# Patient Record
Sex: Male | Born: 1975 | Race: White | Hispanic: No | Smoking: Never smoker
Health system: Southern US, Community
[De-identification: ages and names within clinical notes are randomized; demographics above are authoritative.]

## PROBLEM LIST (undated history)

## (undated) DIAGNOSIS — T8859XA Other complications of anesthesia, initial encounter: Secondary | ICD-10-CM

## (undated) DIAGNOSIS — I10 Essential (primary) hypertension: Secondary | ICD-10-CM

## (undated) DIAGNOSIS — Z9889 Other specified postprocedural states: Secondary | ICD-10-CM

## (undated) DIAGNOSIS — K219 Gastro-esophageal reflux disease without esophagitis: Secondary | ICD-10-CM

## (undated) DIAGNOSIS — G4730 Sleep apnea, unspecified: Secondary | ICD-10-CM

## (undated) DIAGNOSIS — T4145XA Adverse effect of unspecified anesthetic, initial encounter: Secondary | ICD-10-CM

## (undated) DIAGNOSIS — Z8719 Personal history of other diseases of the digestive system: Secondary | ICD-10-CM

## (undated) DIAGNOSIS — R112 Nausea with vomiting, unspecified: Secondary | ICD-10-CM

## (undated) HISTORY — PX: ADENOIDECTOMY: SHX5191

## (undated) HISTORY — PX: ANTERIOR CRUCIATE LIGAMENT REPAIR: SHX115

## (undated) HISTORY — PX: HERNIA REPAIR: SHX51

## (undated) HISTORY — PX: CHOLECYSTECTOMY: SHX55

## (undated) HISTORY — PX: SHOULDER SURGERY: SHX246

---

## 2006-01-31 ENCOUNTER — Encounter: Admission: RE | Admit: 2006-01-31 | Discharge: 2006-01-31 | Payer: Self-pay | Admitting: Orthopedic Surgery

## 2016-03-04 ENCOUNTER — Other Ambulatory Visit: Payer: Self-pay | Admitting: Specialist

## 2016-03-04 ENCOUNTER — Other Ambulatory Visit (HOSPITAL_COMMUNITY): Payer: Self-pay | Admitting: Specialist

## 2018-02-06 ENCOUNTER — Other Ambulatory Visit: Payer: Self-pay | Admitting: Sports Medicine

## 2018-02-06 DIAGNOSIS — M25421 Effusion, right elbow: Secondary | ICD-10-CM

## 2018-02-06 DIAGNOSIS — M25521 Pain in right elbow: Secondary | ICD-10-CM

## 2018-02-14 ENCOUNTER — Other Ambulatory Visit: Payer: Self-pay

## 2018-02-14 ENCOUNTER — Encounter
Admission: RE | Admit: 2018-02-14 | Discharge: 2018-02-14 | Disposition: A | Discharge status: Home / Self Care | Payer: BLUE CROSS/BLUE SHIELD | Source: Ambulatory Visit | Attending: Orthopedic Surgery | Admitting: Orthopedic Surgery

## 2018-02-14 HISTORY — DX: Personal history of other diseases of the digestive system: Z87.19

## 2018-02-14 HISTORY — DX: Other complications of anesthesia, initial encounter: T88.59XA

## 2018-02-14 HISTORY — DX: Gastro-esophageal reflux disease without esophagitis: K21.9

## 2018-02-14 HISTORY — DX: Nausea with vomiting, unspecified: R11.2

## 2018-02-14 HISTORY — DX: Sleep apnea, unspecified: G47.30

## 2018-02-14 HISTORY — DX: Adverse effect of unspecified anesthetic, initial encounter: T41.45XA

## 2018-02-14 HISTORY — DX: Essential (primary) hypertension: I10

## 2018-02-14 HISTORY — DX: Nausea with vomiting, unspecified: Z98.890

## 2018-02-14 NOTE — Patient Instructions (Signed)
Your procedure is scheduled on: 02-16-18 FRIDAY Report to Same Day Surgery 2nd floor medical mall [facility] Entrance-take elevator on left to 2nd floor.  Check in with surgery information desk.) To find out your arrival time please call [phone] between 1PM - 3PM on 02-15-18 THURSDAY  Remember: Instructions that are not followed completely may result in serious medical risk, up to and including death, or upon the discretion of your surgeon and anesthesiologist your surgery may need to be rescheduled.    _x___ 1. Do not eat food after midnight the night before your procedure. NO GUM OR CANDY AFTER MIDNIGHT. You may drink clear liquids up to 2 hours before you are scheduled to arrive at the hospital for your procedure.  Do not drink clear liquids within 2 hours of your scheduled arrival to the hospital.  Clear liquids include  --Water or Apple juice without pulp  --Clear carbohydrate beverage such as ClearFast or Gatorade  --Black Coffee or Clear Tea (No milk, no creamers, do not add anything to the coffee or Tea    __x__ 2. No Alcohol for 24 hours before or after surgery.   __x__3. No Smoking or e-cigarettes for 24 prior to surgery.  Do not use any chewable tobacco products for at least 6 hour prior to surgery   ____  4. Bring all medications with you on the day of surgery if instructed.    __x__ 5. Notify your doctor if there is any change in your medical condition     (cold, fever, infections).    x___6. On the morning of surgery brush your teeth with toothpaste and water.  You may rinse your mouth with mouth wash if you wish.  Do not swallow any toothpaste or mouthwash.   Do not wear jewelry, make-up, hairpins, clips or nail polish.  Do not wear lotions, powders, or perfumes. You may wear deodorant.  Do not shave 48 hours prior to surgery. Men may shave face and neck.  Do not bring valuables to the hospital.    [facility] is not responsible for any belongings or  valuables.               Contacts, dentures or bridgework may not be worn into surgery.  Leave your suitcase in the car. After surgery it may be brought to your room.  For patients admitted to the hospital, discharge time is determined by your treatment team.  _  Patients discharged the day of surgery will not be allowed to drive home.  You will need someone to drive you home and stay with you the night of your procedure.    Please read over the following fact sheets that you were given:   [facility] Preparing for Surgery and or MRSA Information   _x___ TAKE THE FOLLOWING MEDICATION THE MORNING OF SURGERY WITH A SMALL SIP OF WATER. These include:  1. OMEPRAZOLE  2. TAKE AN EXTRA OMEPRAZOLE Thursday NIGHT BEFORE BED  3.  4.  5.  6.  ____Fleets enema or Magnesium Citrate as directed.   _x___ Use CHG Soap or sage wipes as directed on instruction sheet   ____ Use inhalers on the day of surgery and bring to hospital day of surgery  ____ Stop Metformin and Janumet 2 days prior to surgery.    ____ Take 1/2 of usual insulin dose the night before surgery and none on the morning surgery.   ____ Follow recommendations from Cardiologist, Pulmonologist or PCP regarding stopping Aspirin, Coumadin,  Plavix ,Eliquis, Effient, or Pradaxa, and Pletal.  X____Stop Anti-inflammatories such as Advil, Aleve, Ibuprofen, Motrin, Naproxen, Naprosyn, Goodies powders or aspirin products NOW-OK to take Tylenol    _x___ Stop supplements until after surgery-STOP GLUCOSAMINE-CHONDROITIN NOW-MAY RESUME AFTER SURGERY   ____ Bring C-Pap to the hospital.

## 2018-02-15 ENCOUNTER — Encounter
Admission: RE | Admit: 2018-02-15 | Discharge: 2018-02-15 | Disposition: A | Discharge status: Home / Self Care | Payer: BLUE CROSS/BLUE SHIELD | Source: Ambulatory Visit | Attending: Orthopedic Surgery | Admitting: Orthopedic Surgery

## 2018-02-15 DIAGNOSIS — K219 Gastro-esophageal reflux disease without esophagitis: Secondary | ICD-10-CM | POA: Diagnosis not present

## 2018-02-15 DIAGNOSIS — Y998 Other external cause status: Secondary | ICD-10-CM | POA: Diagnosis not present

## 2018-02-15 DIAGNOSIS — Y9375 Activity, martial arts: Secondary | ICD-10-CM | POA: Diagnosis not present

## 2018-02-15 DIAGNOSIS — S46211A Strain of muscle, fascia and tendon of other parts of biceps, right arm, initial encounter: Secondary | ICD-10-CM | POA: Diagnosis not present

## 2018-02-15 DIAGNOSIS — X58XXXA Exposure to other specified factors, initial encounter: Secondary | ICD-10-CM | POA: Diagnosis not present

## 2018-02-15 DIAGNOSIS — I10 Essential (primary) hypertension: Secondary | ICD-10-CM | POA: Diagnosis not present

## 2018-02-15 DIAGNOSIS — Y9239 Other specified sports and athletic area as the place of occurrence of the external cause: Secondary | ICD-10-CM | POA: Diagnosis not present

## 2018-02-15 DIAGNOSIS — M7521 Bicipital tendinitis, right shoulder: Secondary | ICD-10-CM | POA: Diagnosis not present

## 2018-02-15 DIAGNOSIS — Z79899 Other long term (current) drug therapy: Secondary | ICD-10-CM | POA: Diagnosis not present

## 2018-02-15 LAB — POTASSIUM: Potassium: 3.5 mmol/L (ref 3.5–5.1)

## 2018-02-15 MED ORDER — CEFAZOLIN SODIUM-DEXTROSE 2-4 GM/100ML-% IV SOLN
2.0000 g | Freq: Once | INTRAVENOUS | Status: AC
  Administered 2018-02-16: 2 g via INTRAVENOUS

## 2018-02-16 ENCOUNTER — Other Ambulatory Visit: Payer: Self-pay

## 2018-02-16 ENCOUNTER — Ambulatory Visit: Payer: BLUE CROSS/BLUE SHIELD | Admitting: Anesthesiology

## 2018-02-16 ENCOUNTER — Ambulatory Visit
Admission: RE | Admit: 2018-02-16 | Discharge: 2018-02-16 | Disposition: A | Discharge status: Home / Self Care | Payer: BLUE CROSS/BLUE SHIELD | Source: Ambulatory Visit | Attending: Orthopedic Surgery | Admitting: Orthopedic Surgery

## 2018-02-16 ENCOUNTER — Ambulatory Visit: Payer: BLUE CROSS/BLUE SHIELD

## 2018-02-16 ENCOUNTER — Encounter
Admission: RE | Disposition: A | Discharge status: Home / Self Care | Payer: Self-pay | Source: Ambulatory Visit | Attending: Orthopedic Surgery

## 2018-02-16 DIAGNOSIS — I10 Essential (primary) hypertension: Secondary | ICD-10-CM | POA: Insufficient documentation

## 2018-02-16 DIAGNOSIS — K219 Gastro-esophageal reflux disease without esophagitis: Secondary | ICD-10-CM | POA: Insufficient documentation

## 2018-02-16 DIAGNOSIS — S46211A Strain of muscle, fascia and tendon of other parts of biceps, right arm, initial encounter: Secondary | ICD-10-CM | POA: Diagnosis not present

## 2018-02-16 DIAGNOSIS — Y9375 Activity, martial arts: Secondary | ICD-10-CM | POA: Insufficient documentation

## 2018-02-16 DIAGNOSIS — Z419 Encounter for procedure for purposes other than remedying health state, unspecified: Secondary | ICD-10-CM

## 2018-02-16 DIAGNOSIS — Y998 Other external cause status: Secondary | ICD-10-CM | POA: Insufficient documentation

## 2018-02-16 DIAGNOSIS — Z79899 Other long term (current) drug therapy: Secondary | ICD-10-CM | POA: Insufficient documentation

## 2018-02-16 DIAGNOSIS — M7521 Bicipital tendinitis, right shoulder: Secondary | ICD-10-CM | POA: Insufficient documentation

## 2018-02-16 DIAGNOSIS — Y9239 Other specified sports and athletic area as the place of occurrence of the external cause: Secondary | ICD-10-CM | POA: Insufficient documentation

## 2018-02-16 DIAGNOSIS — X58XXXA Exposure to other specified factors, initial encounter: Secondary | ICD-10-CM | POA: Insufficient documentation

## 2018-02-16 HISTORY — PX: DISTAL BICEPS TENDON REPAIR: SHX1461

## 2018-02-16 SURGERY — REPAIR, TENDON, BICEPS, DISTAL
Anesthesia: General | Laterality: Right

## 2018-02-16 MED ORDER — OXYCODONE HCL 5 MG PO TABS
ORAL_TABLET | ORAL | Status: AC
  Filled 2018-02-16: qty 1

## 2018-02-16 MED ORDER — ACETAMINOPHEN 500 MG PO TABS: 1000.0000 mg | ORAL_TABLET | Freq: Three times a day (TID) | ORAL | 2 refills | Status: AC

## 2018-02-16 MED ORDER — ASPIRIN EC 325 MG PO TBEC: 325.0000 mg | DELAYED_RELEASE_TABLET | Freq: Every day | ORAL | 0 refills | Status: AC

## 2018-02-16 MED ORDER — ACETAMINOPHEN 10 MG/ML IV SOLN
INTRAVENOUS | Status: AC
  Filled 2018-02-16: qty 100

## 2018-02-16 MED ORDER — ONDANSETRON HCL 4 MG/2ML IJ SOLN
INTRAMUSCULAR | Status: AC
  Filled 2018-02-16: qty 2

## 2018-02-16 MED ORDER — ONDANSETRON 4 MG PO TBDP: 4.0000 mg | ORAL_TABLET | Freq: Three times a day (TID) | ORAL | 0 refills | Status: AC | PRN

## 2018-02-16 MED ORDER — FENTANYL CITRATE (PF) 100 MCG/2ML IJ SOLN
INTRAMUSCULAR | Status: AC
  Filled 2018-02-16: qty 2

## 2018-02-16 MED ORDER — LIDOCAINE HCL (PF) 2 % IJ SOLN
INTRAMUSCULAR | Status: AC
  Filled 2018-02-16: qty 10

## 2018-02-16 MED ORDER — NEOMYCIN-POLYMYXIN B GU 40-200000 IR SOLN
Status: AC
  Filled 2018-02-16: qty 2

## 2018-02-16 MED ORDER — LACTATED RINGERS IV SOLN
INTRAVENOUS | Status: DC | PRN
  Administered 2018-02-16 (×2): via INTRAVENOUS

## 2018-02-16 MED ORDER — METOPROLOL TARTRATE 5 MG/5ML IV SOLN
INTRAVENOUS | Status: DC | PRN
  Administered 2018-02-16: 2.5 mg via INTRAVENOUS

## 2018-02-16 MED ORDER — FENTANYL CITRATE (PF) 100 MCG/2ML IJ SOLN
INTRAMUSCULAR | Status: DC | PRN
  Administered 2018-02-16 (×2): 50 ug via INTRAVENOUS
  Administered 2018-02-16: 100 ug via INTRAVENOUS
  Administered 2018-02-16 (×2): 50 ug via INTRAVENOUS

## 2018-02-16 MED ORDER — KETOROLAC TROMETHAMINE 30 MG/ML IJ SOLN
INTRAMUSCULAR | Status: DC | PRN
  Administered 2018-02-16: 30 mg via INTRAVENOUS

## 2018-02-16 MED ORDER — OXYCODONE HCL 5 MG PO TABS
5.0000 mg | ORAL_TABLET | Freq: Once | ORAL | Status: AC
  Administered 2018-02-16: 5 mg via ORAL

## 2018-02-16 MED ORDER — DEXAMETHASONE SODIUM PHOSPHATE 10 MG/ML IJ SOLN
INTRAMUSCULAR | Status: AC
  Filled 2018-02-16: qty 1

## 2018-02-16 MED ORDER — LIDOCAINE HCL (CARDIAC) 20 MG/ML IV SOLN
INTRAVENOUS | Status: DC | PRN
  Administered 2018-02-16: 100 mg via INTRAVENOUS

## 2018-02-16 MED ORDER — SCOPOLAMINE 1 MG/3DAYS TD PT72
MEDICATED_PATCH | TRANSDERMAL | Status: AC
  Filled 2018-02-16: qty 1

## 2018-02-16 MED ORDER — FENTANYL CITRATE (PF) 100 MCG/2ML IJ SOLN
25.0000 ug | INTRAMUSCULAR | Status: DC | PRN
  Administered 2018-02-16: 25 ug via INTRAVENOUS

## 2018-02-16 MED ORDER — ACETAMINOPHEN 10 MG/ML IV SOLN
INTRAVENOUS | Status: DC | PRN
  Administered 2018-02-16: 1000 mg via INTRAVENOUS

## 2018-02-16 MED ORDER — SCOPOLAMINE 1 MG/3DAYS TD PT72
1.0000 | MEDICATED_PATCH | TRANSDERMAL | Status: DC
  Administered 2018-02-16: 1.5 mg via TRANSDERMAL

## 2018-02-16 MED ORDER — KETOROLAC TROMETHAMINE 30 MG/ML IJ SOLN
INTRAMUSCULAR | Status: AC
  Filled 2018-02-16: qty 1

## 2018-02-16 MED ORDER — PROPOFOL 10 MG/ML IV BOLUS
INTRAVENOUS | Status: DC | PRN
  Administered 2018-02-16: 150 mg via INTRAVENOUS
  Administered 2018-02-16: 100 mg via INTRAVENOUS
  Administered 2018-02-16: 50 mg via INTRAVENOUS

## 2018-02-16 MED ORDER — ONDANSETRON HCL 4 MG/2ML IJ SOLN
INTRAMUSCULAR | Status: DC | PRN
  Administered 2018-02-16: 4 mg via INTRAVENOUS

## 2018-02-16 MED ORDER — NEOMYCIN-POLYMYXIN B GU 40-200000 IR SOLN
Status: DC | PRN
  Administered 2018-02-16: 2 mL

## 2018-02-16 MED ORDER — ROCURONIUM BROMIDE 100 MG/10ML IV SOLN
INTRAVENOUS | Status: DC | PRN
  Administered 2018-02-16 (×2): 5 mg via INTRAVENOUS

## 2018-02-16 MED ORDER — MIDAZOLAM HCL 2 MG/2ML IJ SOLN
INTRAMUSCULAR | Status: DC | PRN
  Administered 2018-02-16: 2 mg via INTRAVENOUS

## 2018-02-16 MED ORDER — LACTATED RINGERS IV SOLN
INTRAVENOUS | Status: DC
  Administered 2018-02-16: 10:00:00 via INTRAVENOUS

## 2018-02-16 MED ORDER — ONDANSETRON HCL 4 MG/2ML IJ SOLN
4.0000 mg | Freq: Once | INTRAMUSCULAR | Status: DC | PRN
Start: 2018-02-16 — End: 2018-02-16

## 2018-02-16 MED ORDER — MIDAZOLAM HCL 2 MG/2ML IJ SOLN
INTRAMUSCULAR | Status: AC
  Filled 2018-02-16: qty 2

## 2018-02-16 MED ORDER — OXYCODONE HCL 5 MG PO TABS: 5.0000 mg | ORAL_TABLET | ORAL | 0 refills | Status: AC | PRN

## 2018-02-16 MED ORDER — CEFAZOLIN SODIUM-DEXTROSE 2-4 GM/100ML-% IV SOLN
INTRAVENOUS | Status: AC
  Filled 2018-02-16: qty 100

## 2018-02-16 MED ORDER — DEXAMETHASONE SODIUM PHOSPHATE 10 MG/ML IJ SOLN
INTRAMUSCULAR | Status: DC | PRN
  Administered 2018-02-16: 10 mg via INTRAVENOUS

## 2018-02-16 MED ORDER — PROPOFOL 10 MG/ML IV BOLUS
INTRAVENOUS | Status: AC
  Filled 2018-02-16: qty 20

## 2018-02-16 MED ORDER — METOPROLOL TARTRATE 5 MG/5ML IV SOLN
INTRAVENOUS | Status: AC
  Filled 2018-02-16: qty 5

## 2018-02-16 SURGICAL SUPPLY — 48 items
8MM REAMER ×3 IMPLANT
BANDAGE ACE 4X5 VEL STRL LF (GAUZE/BANDAGES/DRESSINGS) IMPLANT
BNDG COHESIVE 4X5 TAN STRL (GAUZE/BANDAGES/DRESSINGS) IMPLANT
BNDG ESMARK 4X12 TAN STRL LF (GAUZE/BANDAGES/DRESSINGS) ×3 IMPLANT
CANISTER SUCT 1200ML W/VALVE (MISCELLANEOUS) ×3 IMPLANT
CHLORAPREP W/TINT 26ML (MISCELLANEOUS) ×3 IMPLANT
CLOSURE WOUND 1/2 X4 (GAUZE/BANDAGES/DRESSINGS)
CUFF TOURN 24 STER (MISCELLANEOUS) ×3 IMPLANT
DERMABOND ADVANCED (GAUZE/BANDAGES/DRESSINGS) ×2
DERMABOND ADVANCED .7 DNX12 (GAUZE/BANDAGES/DRESSINGS) ×1 IMPLANT
DRAPE C-ARM XRAY 36X54 (DRAPES) ×3 IMPLANT
DRAPE FLUOR MINI C-ARM 54X84 (DRAPES) IMPLANT
DRAPE SURG 17X11 SM STRL (DRAPES) ×3 IMPLANT
DRAPE U-SHAPE 47X51 STRL (DRAPES) ×3 IMPLANT
ELECT REM PT RETURN 9FT ADLT (ELECTROSURGICAL) ×3
ELECTRODE REM PT RTRN 9FT ADLT (ELECTROSURGICAL) ×1 IMPLANT
GAUZE PETRO XEROFOAM 1X8 (MISCELLANEOUS) IMPLANT
GAUZE SPONGE 4X4 12PLY STRL (GAUZE/BANDAGES/DRESSINGS) IMPLANT
GAUZE SPONGE 4X4 16PLY XRAY LF (GAUZE/BANDAGES/DRESSINGS) ×3 IMPLANT
GLOVE INDICATOR 8.0 STRL GRN (GLOVE) IMPLANT
GLOVE SURG ORTHO 8.0 STRL STRW (GLOVE) IMPLANT
GOWN STRL REUS W/ TWL LRG LVL3 (GOWN DISPOSABLE) ×2 IMPLANT
GOWN STRL REUS W/ TWL XL LVL3 (GOWN DISPOSABLE) IMPLANT
GOWN STRL REUS W/TWL LRG LVL3 (GOWN DISPOSABLE) ×4
GOWN STRL REUS W/TWL XL LVL3 (GOWN DISPOSABLE)
KIT TURNOVER KIT A (KITS) ×3 IMPLANT
LOOP VESSEL MINI 0.8X406 BLUE (MISCELLANEOUS) ×1 IMPLANT
LOOP VESSEL SUPERMAXI WHITE (MISCELLANEOUS) ×3 IMPLANT
LOOPS BLUE MINI 0.8X406MM (MISCELLANEOUS) ×2
NDL MAYO CATGUT SZ5 (NEEDLE)
NDL SUT 5 .5 CRC TPR PNT MAYO (NEEDLE) IMPLANT
PACK EXTREMITY ARMC (MISCELLANEOUS) ×3 IMPLANT
PAD CAST CTTN 4X4 STRL (SOFTGOODS) IMPLANT
PADDING CAST 4IN STRL (MISCELLANEOUS)
PADDING CAST BLEND 4X4 STRL (MISCELLANEOUS) IMPLANT
PADDING CAST COTTON 4X4 STRL (SOFTGOODS)
REAMER CANNULATED HEADED 8 (Hips) ×3 IMPLANT
SLING ARM LRG DEEP (SOFTGOODS) ×3 IMPLANT
SLING ULTRA II LG (MISCELLANEOUS) IMPLANT
STOCKINETTE IMPERV 14X48 (MISCELLANEOUS) ×3 IMPLANT
STRIP CLOSURE SKIN 1/2X4 (GAUZE/BANDAGES/DRESSINGS) IMPLANT
SUT 2 FIBERLOOP 20 STRT BLUE (SUTURE) ×3
SUT MNCRL AB 4-0 PS2 18 (SUTURE) ×3 IMPLANT
SUT VIC AB 1 CT1 36 (SUTURE) IMPLANT
SUT VIC AB 2-0 CT1 27 (SUTURE)
SUT VIC AB 2-0 CT1 TAPERPNT 27 (SUTURE) IMPLANT
SUTURE 2 FIBERLOOP 20 STRT BLU (SUTURE) ×1 IMPLANT
SYSTEM ARTHRO FOR DISTAL BICEP (Orthopedic Implant) ×3 IMPLANT

## 2018-02-16 NOTE — Transfer of Care (Signed)
Immediate Anesthesia Transfer of Care Note  Patient: Jeffrey Huffman  Procedure(s) Performed: DISTAL BICEPS TENDON REPAIR (Right )  Patient Location: PACU  Anesthesia Type:General  Level of Consciousness: sedated  Airway & Oxygen Therapy: Patient Spontanous Breathing and Patient connected to face mask oxygen  Post-op Assessment: Report given to RN and Post -op Vital signs reviewed and stable  Post vital signs: Reviewed and stable  Last Vitals:  Vitals:   02/16/18 0955 02/16/18 1551  BP: (!) 141/91 (!) 141/75  Pulse: 95 84  Resp: 18 15  Temp: (!) 36.2 C 37.1 C  SpO2: 99% 98%    Last Pain:  Vitals:   02/16/18 0955  TempSrc: Tympanic  PainSc: 4          Complications: No apparent anesthesia complications

## 2018-02-16 NOTE — Discharge Instructions (Signed)
Post-Op Instructions - Distal Biceps Repair  1. Bracing: You will wear a sling for 3-4 days until your first PT appointment. Wean from sling after PT visit.  2. Driving: No driving for at least 2 weeks post-op.   3. Activity:  - After 1st PT visit: Progress towards full passive RoM - 1 week - Can start active RoM - 2 weeks - Can start strengthening with 1 lb weights - 3 weeks - ADLs and active motion as tolerated. No more than 5 lb lifting. - 4 weeks - can gradually resume most normal activities ie raking leaves, mowing yard, shooting basketball, driving, etc. No more than 10 lb lifting. - 6 weeks - Can start lifting heavier weights (~20-30 lbs) - 2-3 months - Can lift gym type weights (~80-100 lbs)  4. Physical Therapy: Begins 3-4 days after surgery  5. Medications:  - You will be provided a prescription for narcotic pain medicine. After surgery, take 1-2 narcotic tablets every 4 hours if needed for severe pain.  - A prescription for anti-nausea medication will be provided in case the narcotic medicine causes nausea - take 1 tablet every 6 hours only if nauseated.   - Take ASA 325mg /day x 2 weeks to help prevent DVTs/PEs (blood clots).  - Take Tylenol 1000mg  (2 extra strength tablets or 3 regular strength tablets) three times/day until your pain improves. This will reduce the amount of narcotic pain medication needed.   If you are taking prescription medication for anxiety, depression, insomnia, muscle spasm, chronic pain, or for attention deficit disorder, you are advised that you are at a higher risk of adverse effects with use of narcotics post-op, including narcotic addiction/dependence, depressed breathing, death. If you use non-prescribed substances: alcohol, marijuana, cocaine, heroin, methamphetamines, etc., you are at a higher risk of adverse effects with use of narcotics post-op, including narcotic addiction/dependence, depressed breathing, death. You are advised that taking > 50  morphine milligram equivalents (MME) of narcotic pain medication per day results in twice the risk of overdose or death. For your prescription provided: hydrocodone 5 mg - taking more than 8 tablets per day would result in > 50 morphine milligram equivalents (MME) of narcotic pain medication. Be advised that we will prescribe narcotics short-term, for acute post-operative pain only - 3 weeks for major operations such as shoulder repair/reconstruction surgeries.   6. Post-Op Appointment:  Your first post-op appointment will be 10-14 days post-op.  7. Work or School: For most, but not all procedures, we advise staying out of work or school for at least 1 to 2 weeks in order to recover from the stress of surgery and to allow time for healing.   If you need a work or school note this can be provided.   8. Smoking: If you are a smoker, you need to refrain from smoking in the postoperative period. The nicotine in cigarettes will inhibit healing of your shoulder repair and decrease the chance of successful repair. Similarly, nicotine containing products (gum, patches) should be avoided.   AMBULATORY SURGERY  DISCHARGE INSTRUCTIONS  1) The drugs that you were given will stay in your system until tomorrow so for the next 24 hours you should not:  A) Drive an automobile B) Make any legal decisions C) Drink any alcoholic beverage  2) You may resume regular meals tomorrow.  Today it is better to start with liquids and gradually work up to solid foods. You may eat anything you prefer, but it is better to start with  liquids, then soup and crackers, and gradually work up to solid foods.  3) Please notify your doctor immediately if you have any unusual bleeding, trouble breathing, redness and pain at the surgery site, drainage, fever, or pain not relieved by medication.  4) Additional Instructions:   Please contact your physician with any problems or Same Day Surgery at [phone], Monday through  Friday 6 am to 4 pm, or [place] [facility] number at [phone].

## 2018-02-16 NOTE — H&P (Signed)
Paper H&P to be scanned into permanent record. H&P reviewed. No significant changes noted.  

## 2018-02-16 NOTE — Op Note (Signed)
Operative Note    SURGERY DATE: 02/16/2018   PRE-OP DIAGNOSIS:  1. Right distal biceps rupture   POST-OP DIAGNOSIS:  1. Right distal biceps rupture  PROCEDURES:  1. Right distal biceps repair   SURGEON: Rosealee AlbeeSunny H. Kendel Pesnell, MD   ANESTHESIA: Gen   ESTIMATED BLOOD LOSS: 5cc   TOTAL IV FLUIDS: see anesthesia record  IMPLANTS: Arthrex biceps button and 7mm interference screw   INDICATION(S):  Jeffrey Huffman is a 42 y.o. male who sustained a distal biceps rupture during a martial arts class. Clinical and MRI findings confirmed diagnosis.  After discussion of risks, benefits, and alternatives to surgery, the patient and family elected to proceed with distal biceps repair   OPERATIVE FINDINGS: Right distal biceps rupture   OPERATIVE REPORT:   I identified Jeffrey EvaStanley N Eastlick Huffman in the pre-operative holding area. Informed consent was obtained and the surgical site was marked. I reviewed the risks and benefits of the proposed surgical intervention and the patient (and/or patient's guardian) wished to proceed. The patient was transferred to the operative suite and general anesthesia was administered. The patient was placed in the supine position with arm on an arm board.  All down side pressure points were appropriately padded. Appropriate IV antibiotics were administered within 30 minutes before incision. The extremity was then prepped and draped in standard fashion. A time out was performed confirming the correct extremity, correct patient, and correct procedure.   A 6 cm transverse incision was made approximately 3 cm distal to the elbow flexion crease.  Care was taken to carefully dissect down to the fascial layer of the brachial radialis and pronator teres.  The lateral antebrachial cutaneous nerve was not encountered.  The fascial layer was then incised.  Dissection was carried proximally to the biceps muscle and retracted tendon.  The tendon was found slightly ulnarly and brought out through  the wound.  The ends of the tendon were freshened with a 15 blade and tapered to allow for easier passage at the time of fixation.  A fiber loop suture was placed along the distal 25 mm of the tendon.  The tendon was sized to 7 mm.  The tendon was then wrapped in saline gauze and retracted proximally.  Deep dissection was carefully carried out to the level of the radial tuberosity using fluoroscopy for confirmation.  Care was taken to not retract on the lateral and posterior side of the radius to avoid injuring the posterior interosseous nerve.  A guidepin was placed in the center of the radial tuberosity aiming in a approximately 30 degree direction towards the ulna to avoid injuring the posterior osseous nerve. Position was confirmed with fluoroscopy.  An 8 mm reamer was used to drill a unicortical socket over the guidepin.  All bony debris from this was removed to use the risk of heterotopic ossification.  The wound was then irrigated.  The suture tails of the fiber loop on the biceps tendon were then passed through the biceps button to allow for a tension slide technique.  The button was then inserted through the radial tuberosity to rest on the far cortex of the radius and the fiber loop sutures were then pulled to advance the biceps tendon into the socket.  The 7 mm interference screw was then placed on the radial side of the socket to push the biceps tendon towards its more anatomical position on the ulnar side of the radial tuberosity.  Fluoroscopy was used to confirm appropriate position of the  biceps button.  The elbow was taken through a range of motion and there was no gapping or movement of the biceps tendon.  Additionally the patient's hook test clinically was now normal.  The tourniquet was let down with a tourniquet time of 121 minutes.  There was no significant bleeding.  The wound was again copiously irrigated.  The subdermal layer was closed with 3-0 Vicryl and skin was closed with a running  subcuticular 4-0 Monocryl.  Dermabond was applied. Sterile soft dressing was placed. A sling was placed. Patient was extubated, transferred to a stretcher bed and to the post anesthesia care unit in stable condition.   POSTOPERATIVE PLAN: The patient will be discharged home from PACU. Operative arm to remain in sling until outpatient PT, which will start in 3-4 days. Patient to return to clinic in ~2 weeks for post-operative appointment.    REHAB PROTOCOL: - After 1st PT visit: Wean from sling. Progress towards full passive RoM - 1 week - Can start active RoM - 2 weeks - Can start strengthening with 1 lb weights - 3 weeks - ADLs and active motion as tolerated. No more than 5 lb lifting. - 4 weeks - can gradually resume most normal activities ie raking leaves, mowing yard, shooting basketball, driving, etc. No more than 10 lb lifting. - 6 weeks - Can start lifting heavier weights (~20-30 lbs) - 2-3 months - Can lift gym type weights (~80-100 lbs)

## 2018-02-16 NOTE — Anesthesia Postprocedure Evaluation (Signed)
Anesthesia Post Note  Patient: Jeffrey Huffman  Procedure(s) Performed: DISTAL BICEPS TENDON REPAIR (Right )  Patient location during evaluation: PACU Anesthesia Type: General Level of consciousness: awake and alert Pain management: pain level controlled Vital Signs Assessment: post-procedure vital signs reviewed and stable Respiratory status: spontaneous breathing, nonlabored ventilation, respiratory function stable and patient connected to nasal cannula oxygen Cardiovascular status: blood pressure returned to baseline and stable Postop Assessment: no apparent nausea or vomiting Anesthetic complications: no     Last Vitals:  Vitals:   02/16/18 1709 02/16/18 1749  BP: (!) 147/91 126/75  Pulse: 79 81  Resp: 18 16  Temp: (!) 36.1 C   SpO2: 94% 97%    Last Pain:  Vitals:   02/16/18 1749  TempSrc:   PainSc: 4                  Lenard SimmerAndrew Brooklen Runquist

## 2018-02-16 NOTE — Anesthesia Preprocedure Evaluation (Signed)
Anesthesia Evaluation  Patient identified by MRN, date of birth, ID band Patient awake    Reviewed: Allergy & Precautions, NPO status , Patient's Chart, lab work & pertinent test results  History of Anesthesia Complications (+) PONV and history of anesthetic complications  Airway Mallampati: II       Dental   Pulmonary sleep apnea (re-tested and no longer needs CPAP) , neg COPD,           Cardiovascular hypertension, Pt. on medications (-) Past MI and (-) CHF (-) dysrhythmias (-) Valvular Problems/Murmurs     Neuro/Psych neg Seizures    GI/Hepatic Neg liver ROS, hiatal hernia, GERD  Medicated,  Endo/Other  neg diabetes  Renal/GU negative Renal ROS     Musculoskeletal   Abdominal   Peds  Hematology   Anesthesia Other Findings   Reproductive/Obstetrics                             Anesthesia Physical Anesthesia Plan  ASA: II  Anesthesia Plan: General   Post-op Pain Management:    Induction: Intravenous  PONV Risk Score and Plan:   Airway Management Planned: LMA  Additional Equipment:   Intra-op Plan:   Post-operative Plan:   Informed Consent: I have reviewed the patients History and Physical, chart, labs and discussed the procedure including the risks, benefits and alternatives for the proposed anesthesia with the patient or authorized representative who has indicated his/her understanding and acceptance.     Plan Discussed with:   Anesthesia Plan Comments:         Anesthesia Quick Evaluation

## 2018-02-16 NOTE — Anesthesia Post-op Follow-up Note (Signed)
Anesthesia QCDR form completed.        

## 2018-02-20 ENCOUNTER — Encounter: Payer: Self-pay | Admitting: Orthopedic Surgery

## 2019-10-23 IMAGING — XA DG HUMERUS 2V *R*
4 series · 4 of 4 positions shown · non-contrast
Comparison: None.

CLINICAL DATA: 42-year-old male undergoing right biceps tendon
repair.

EXAM:
DG C-ARM 61-120 MIN; RIGHT HUMERUS - 2+ VIEW

[Series 1: cont. · 1 of 1 slices shown (1 of 4)]
[im 1/1]
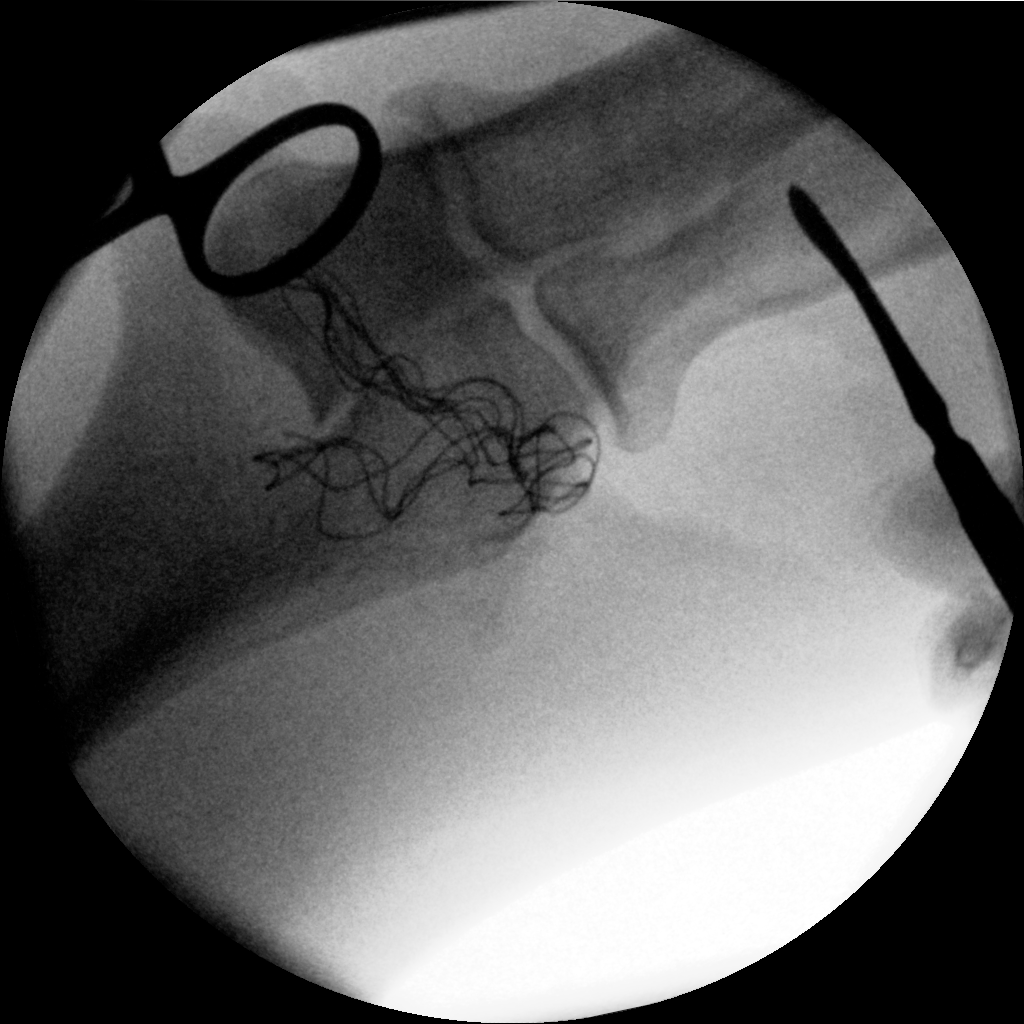

[Series 2: cont. · 1 of 1 slices shown (2 of 4)]
[im 1/1]
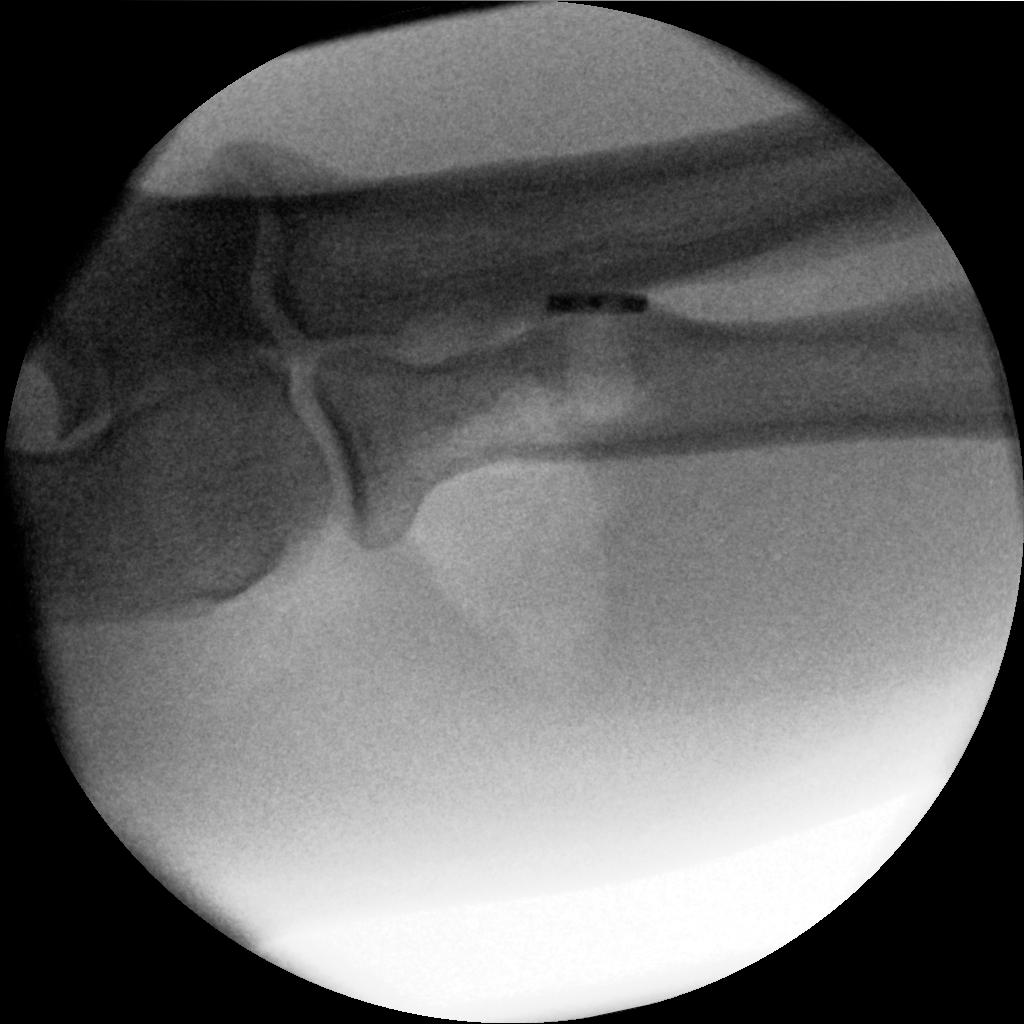

[Series 3: cont. · 1 of 1 slices shown (3 of 4)]
[im 1/1]
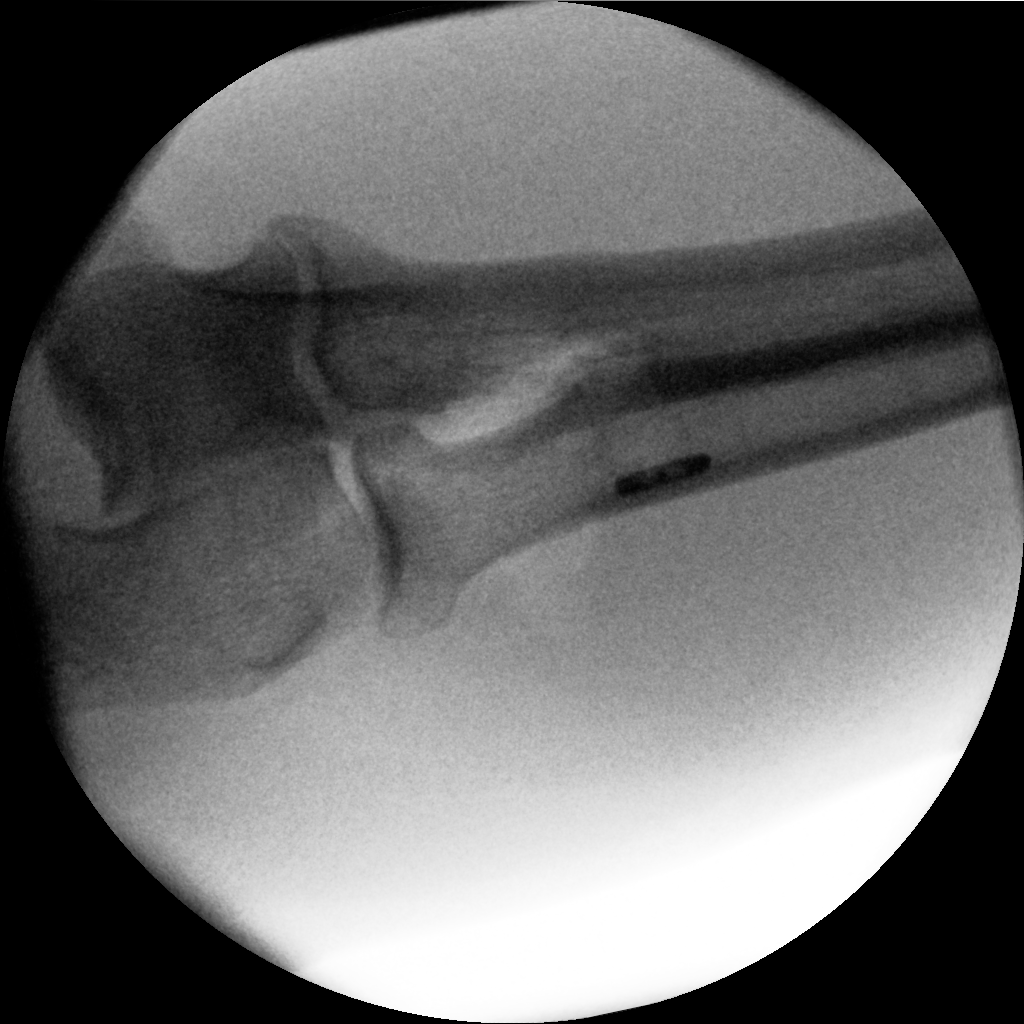

[Series 4: cont. · 1 of 1 slices shown (4 of 4)]
[im 1/1]
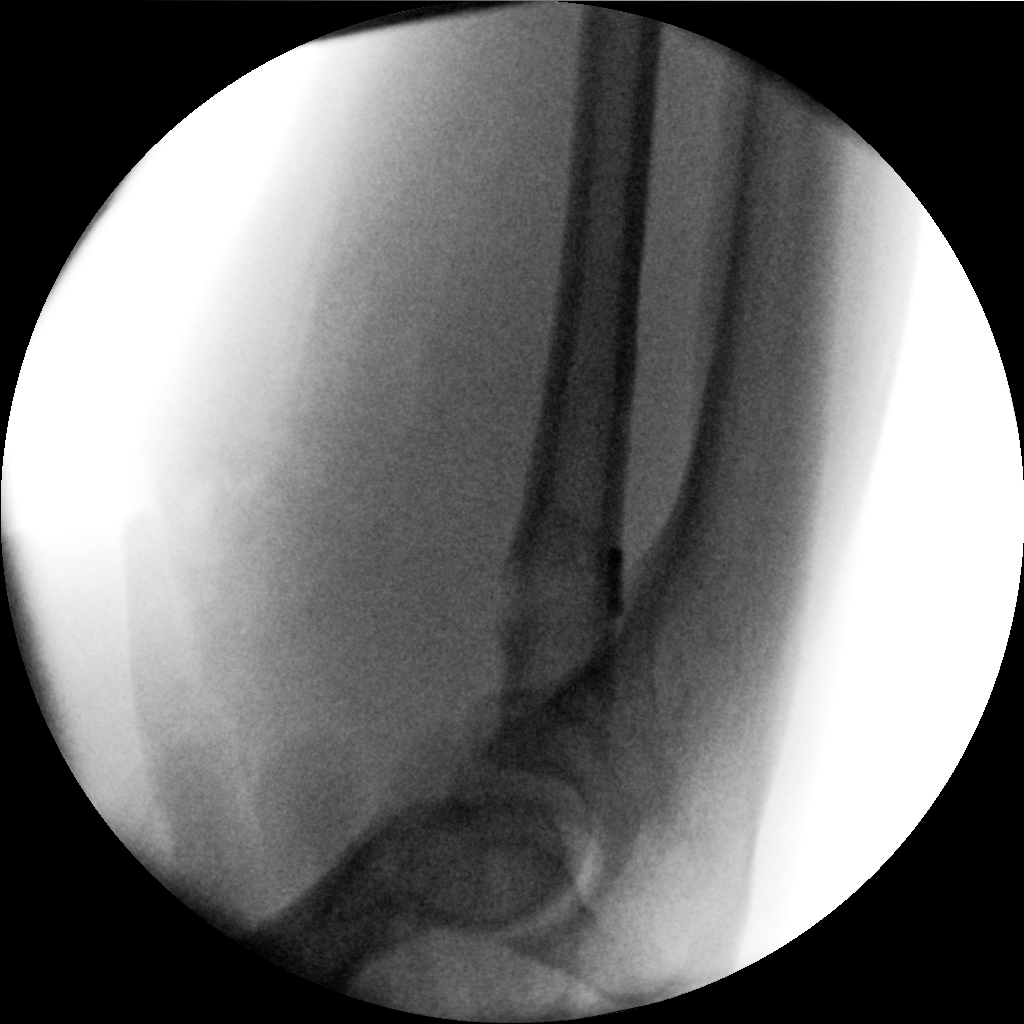

[4 of 4 positions shown; findings below may reference images not displayed]

FINDINGS: Four intraoperative fluoroscopic spot views of the proximal radius
demonstrating instrumentation and small metallic fastener placed at
the biceps insertion site.

FLUOROSCOPY TIME:  0 minutes 18 seconds
IMPRESSION: Intraoperative fluoroscopy during biceps tendon repair.
